# Patient Record
Sex: Male | Born: 1975 | Race: Black or African American | Hispanic: No | Marital: Single | Smoking: Current every day smoker
Health system: Southern US, Community
[De-identification: ages and names within clinical notes are randomized; demographics above are authoritative.]

## PROBLEM LIST (undated history)

## (undated) DIAGNOSIS — H269 Unspecified cataract: Secondary | ICD-10-CM

## (undated) HISTORY — DX: Unspecified cataract: H26.9

---

## 2011-06-26 ENCOUNTER — Emergency Department (HOSPITAL_COMMUNITY)
Admission: EM | Admit: 2011-06-26 | Discharge: 2011-06-27 | Disposition: A | Discharge status: Home / Self Care | Payer: Self-pay | Attending: Emergency Medicine | Admitting: Emergency Medicine

## 2011-06-26 DIAGNOSIS — R002 Palpitations: Secondary | ICD-10-CM

## 2011-06-26 DIAGNOSIS — R079 Chest pain, unspecified: Secondary | ICD-10-CM | POA: Insufficient documentation

## 2011-06-26 DIAGNOSIS — K089 Disorder of teeth and supporting structures, unspecified: Secondary | ICD-10-CM | POA: Insufficient documentation

## 2011-06-26 DIAGNOSIS — F17200 Nicotine dependence, unspecified, uncomplicated: Secondary | ICD-10-CM | POA: Insufficient documentation

## 2011-06-26 DIAGNOSIS — K0889 Other specified disorders of teeth and supporting structures: Secondary | ICD-10-CM

## 2011-06-26 NOTE — ED Notes (Signed)
Pt states "I just feel loopy"  "I took an anaciid and ibuprofen today"  Pt reports pain radiating to throat and left arm.  Pt denies n/v or SOB.  Pt reports pain in mid chest wall.  Pt rates pain 5/10.   Pt states "Its more like discomfort and not pain"

## 2011-06-27 ENCOUNTER — Emergency Department (HOSPITAL_COMMUNITY): Payer: Self-pay

## 2011-06-27 MED ORDER — IBUPROFEN 600 MG PO TABS: 600.0000 mg | ORAL_TABLET | Freq: Three times a day (TID) | ORAL | Status: AC | PRN

## 2011-06-27 MED ORDER — PENICILLIN V POTASSIUM 500 MG PO TABS: 500.0000 mg | ORAL_TABLET | Freq: Four times a day (QID) | ORAL | Status: AC

## 2011-06-27 NOTE — ED Notes (Signed)
Pt denies pain stating "I don't feel bad like I did when I came in here". Pt denies pain; no s/s of distress noted.

## 2011-06-27 NOTE — ED Provider Notes (Signed)
History     CSN: [id number]  Arrival date & time 06/26/11  2225   First MD Initiated Contact with Patient 06/27/11 0401      Chief Complaint  Patient presents with  . Chest Pain     The history is provided by the patient.   The patient reports right-sided dental pain that started today.  He took ibuprofen Orajel with improvement in his symptoms.  As he was concerned about his tooth he began having palpitations without chest discomfort.  His palpitations are transient and reports only occasionally.  He denies shortness of breath.  He has no history of hypertension hyperlipidemia diabetes.  There is no family history of early heart disease.  He denies chest pain or palpitations at this time.  He does smoke cigarettes but does not do any drugs.  He does not have a primary care physician.     No past medical history on file.  No past surgical history on file.  No family history on file.  History  Substance Use Topics  . Smoking status: Not on file  . Smokeless tobacco: Not on file  . Alcohol Use: Not on file      Review of Systems  All other systems reviewed and are negative.    Allergies  Review of patient's allergies indicates no known allergies.  Home Medications  No current outpatient prescriptions on file.  BP 123/83  Pulse 48  Temp 98.8 F (37.1 C) (Oral)  Resp 25  SpO2 100%  Physical Exam  Nursing note and vitals reviewed. Constitutional: He is oriented to person, place, and time. He appears well-developed and well-nourished.  HENT:  Head: Normocephalic and atraumatic.       Mild tenderness of his right lower second molar without gingival swelling or fluctuance  Eyes: EOM are normal.  Neck: Normal range of motion.  Cardiovascular: Normal rate, regular rhythm, normal heart sounds and intact distal pulses.   Pulmonary/Chest: Effort normal and breath sounds normal. No respiratory distress.  Abdominal: Soft. He exhibits no distension. There is no  tenderness.  Musculoskeletal: Normal range of motion.  Neurological: He is alert and oriented to person, place, and time.  Skin: Skin is warm and dry.  Psychiatric: He has a normal mood and affect. Judgment normal.    ED Course  Procedures (including critical care time)  Labs Reviewed - No data to display Dg Chest 2 View  06/27/2011  *RADIOLOGY REPORT*  Clinical Data: Left upper chest pain for 24 hours.  CHEST - 2 VIEW  Comparison: None.  Findings: The heart size and pulmonary vascularity are normal. The lungs appear clear and expanded without focal air space disease or consolidation. No blunting of the costophrenic angles.  No pneumothorax.  IMPRESSION: No evidence of active pulmonary disease.  Original Report Authenticated By: Marlon Pel, M.D.    I personally reviewed the imaging tests through PACS system  I reviewed available ER/hospitalization records thought the EMR    Date: 06/27/2011  Rate: 67  Rhythm: normal sinus rhythm  QRS Axis: normal  Intervals: normal  ST/T Wave abnormalities: normal  Conduction Disutrbances: none  Narrative Interpretation:   Old EKG Reviewed: No significant changes noted     1. Pain, dental   2. Palpitations       MDM  Dental Pain. Home with antibiotics and pain medicine. Recommend dental follow up. No signs of gingival abscess. Tolerating secretions. Airway patent. No sub lingular swelling  Benign palpitations. Doubt ACS. ecg normal.  Lyanne Co, MD 06/27/11 [phone]

## 2011-06-27 NOTE — ED Notes (Signed)
Patient transported to X-ray 

## 2011-06-27 NOTE — Discharge Instructions (Signed)
Dental Pain  A tooth ache may be caused by cavities (tooth decay). Cavities expose the nerve of the tooth to air and hot or cold temperatures. It may come from an infection or abscess (also called a boil or furuncle) around your tooth. It is also often caused by dental caries (tooth decay). This causes the pain you are having.  DIAGNOSIS   Your caregiver can diagnose this problem by exam.  TREATMENT   · If caused by an infection, it may be treated with medications which kill germs (antibiotics) and pain medications as prescribed by your caregiver. Take medications as directed.  · Only take over-the-counter or prescription medicines for pain, discomfort, or fever as directed by your caregiver.  · Whether the tooth ache today is caused by infection or dental disease, you should see your dentist as soon as possible for further care.  SEEK MEDICAL CARE IF:  The exam and treatment you received today has been provided on an emergency basis only. This is not a substitute for complete medical or dental care. If your problem worsens or new problems (symptoms) appear, and you are unable to meet with your dentist, call or return to this location.  SEEK IMMEDIATE MEDICAL CARE IF:   · You have a fever.  · You develop redness and swelling of your face, jaw, or neck.  · You are unable to open your mouth.  · You have severe pain uncontrolled by pain medicine.  MAKE SURE YOU:   · Understand these instructions.  · Will watch your condition.  · Will get help right away if you are not doing well or get worse.  Document Released: 12/19/2004 Document Revised: 12/08/2010 Document Reviewed: 08/07/2007  Exit[employer]® Patient Information ©2012 [employer].

## 2011-06-27 NOTE — ED Notes (Signed)
Returned from xray

## 2013-12-27 IMAGING — CR DG CHEST 2V
2 series · 2 of 2 positions shown · non-contrast
Comparison: None.

CLINICAL DATA: Left upper chest pain for 24 hours.

CHEST - 2 VIEW

[w chest lat]
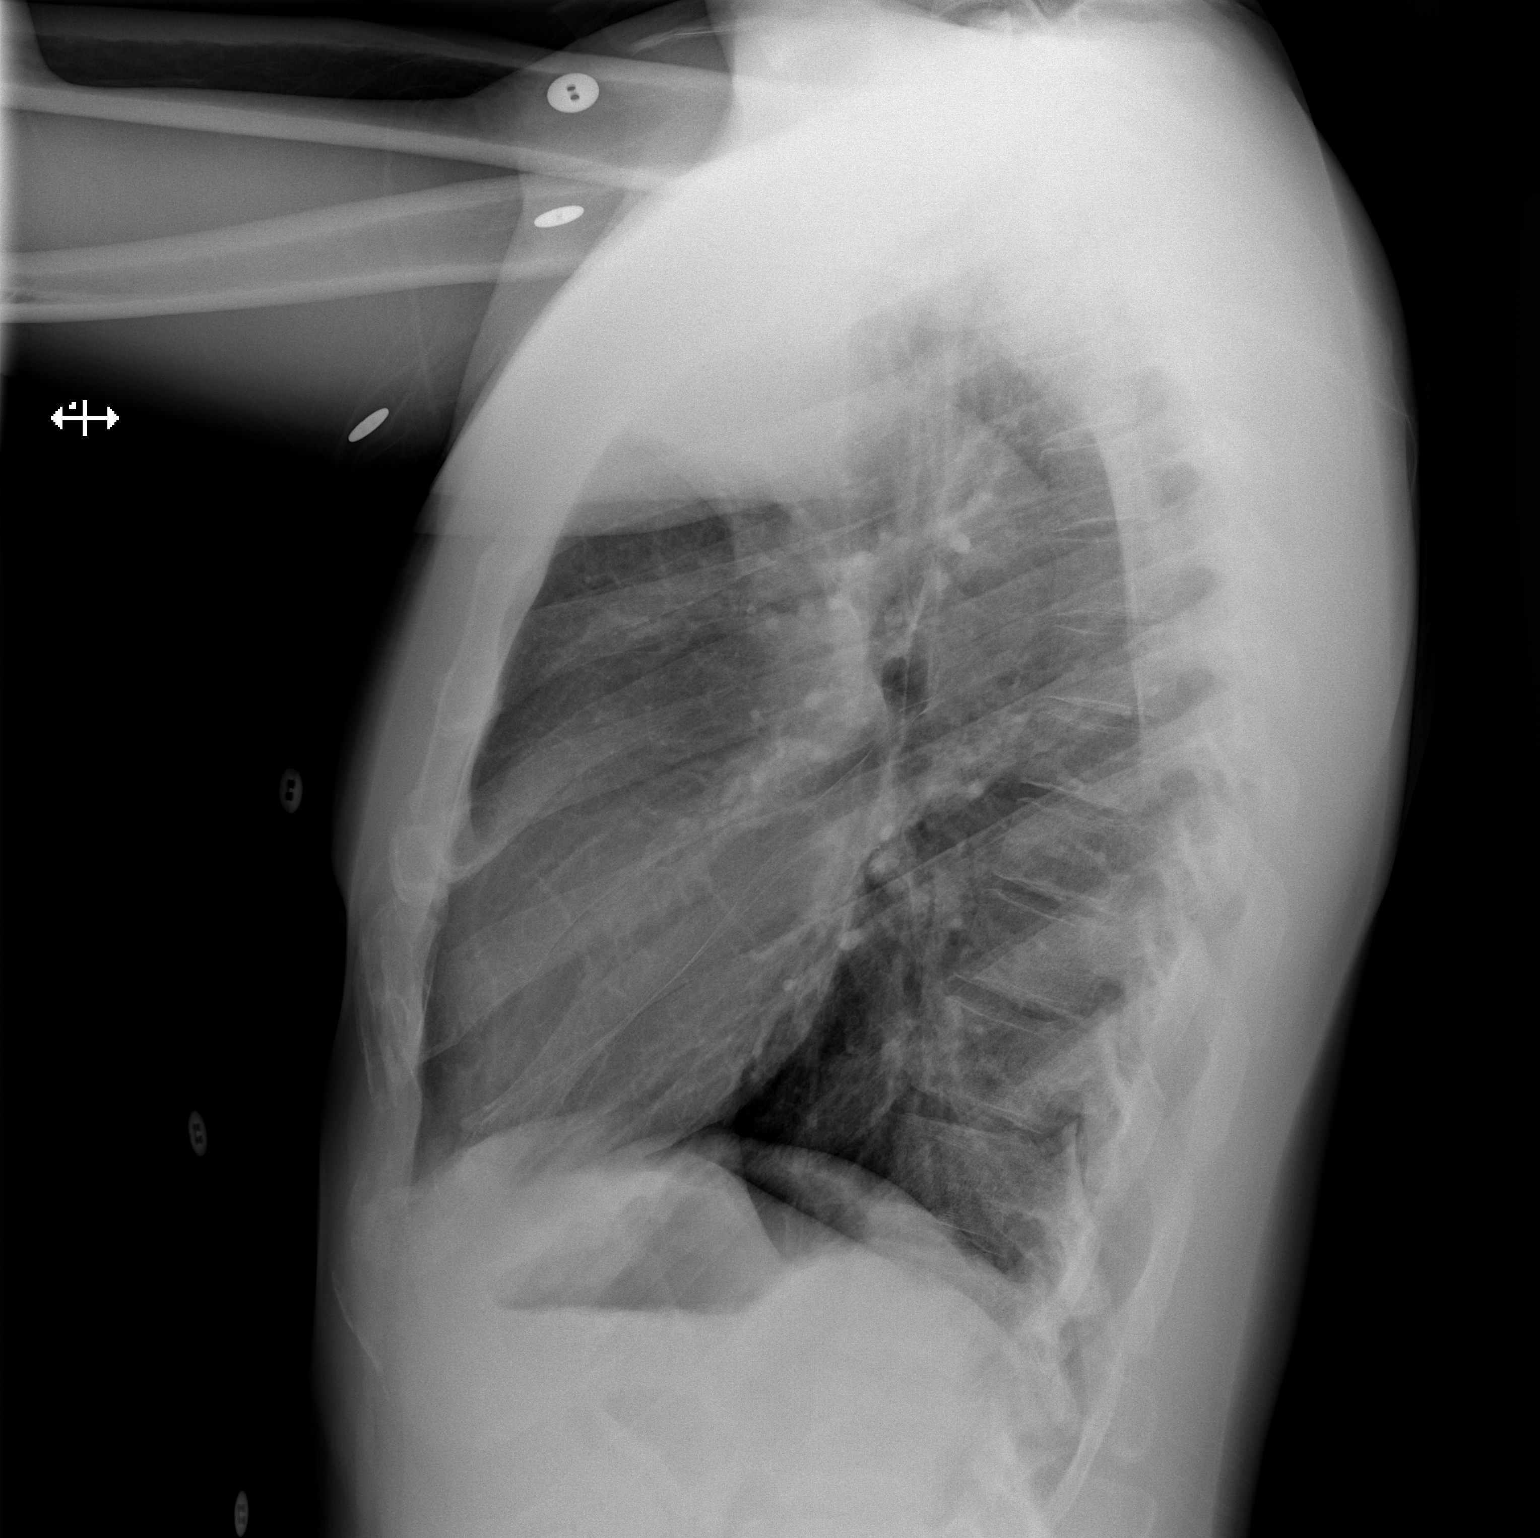

[w chest pa]
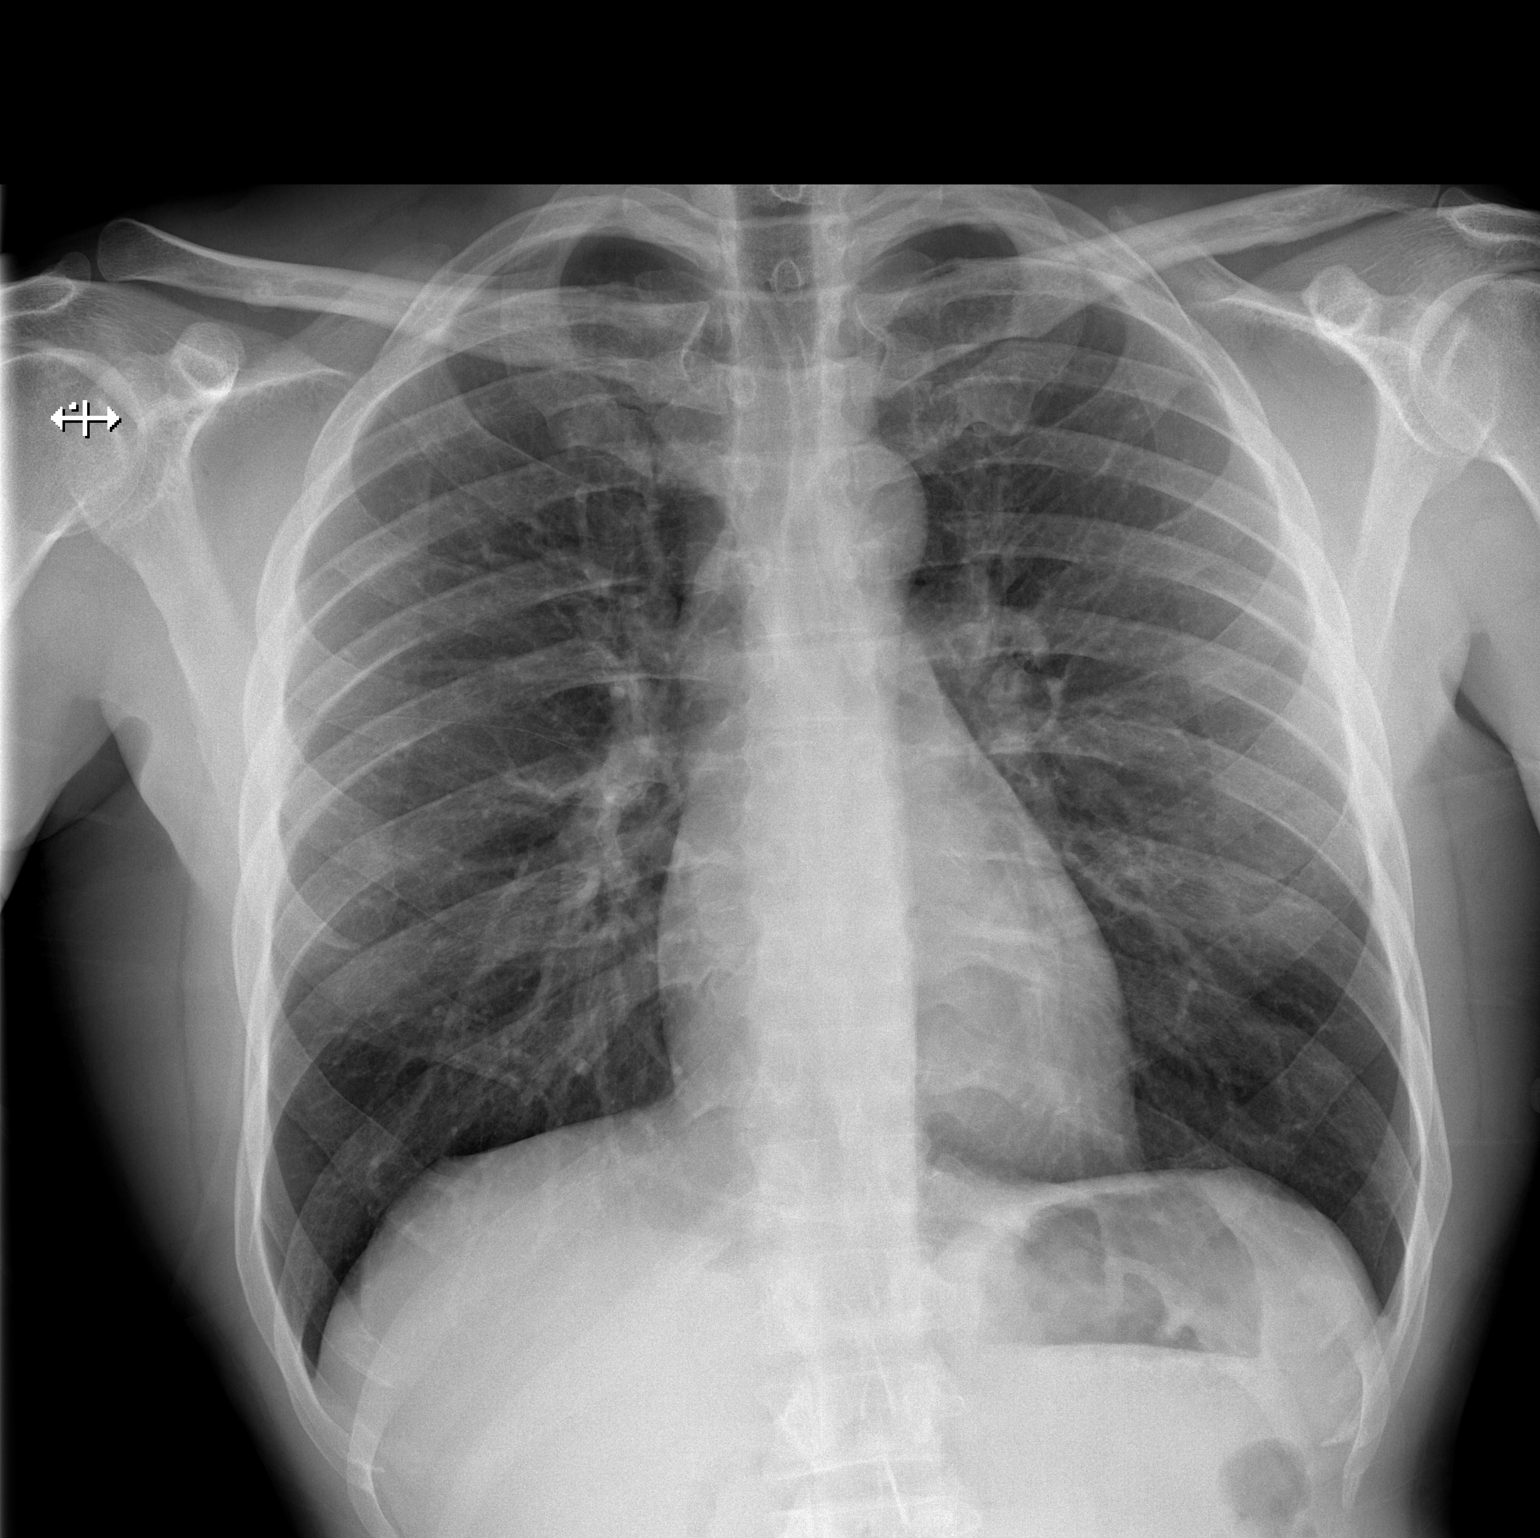

[2 of 2 positions shown; findings below may reference images not displayed]

FINDINGS: The heart size and pulmonary vascularity are normal. The
lungs appear clear and expanded without focal air space disease or
consolidation. No blunting of the costophrenic angles.  No
pneumothorax.
IMPRESSION: No evidence of active pulmonary disease.

## 2018-06-11 ENCOUNTER — Encounter (INDEPENDENT_AMBULATORY_CARE_PROVIDER_SITE_OTHER): Payer: Self-pay | Admitting: Ophthalmology

## 2018-06-11 ENCOUNTER — Ambulatory Visit (INDEPENDENT_AMBULATORY_CARE_PROVIDER_SITE_OTHER): Payer: Worker's Compensation | Admitting: Ophthalmology

## 2018-06-11 ENCOUNTER — Ambulatory Visit (HOSPITAL_COMMUNITY)
Admission: EM | Admit: 2018-06-11 | Discharge: 2018-06-11 | Disposition: A | Discharge status: Home / Self Care | Payer: Worker's Compensation | Attending: Family Medicine | Admitting: Family Medicine

## 2018-06-11 ENCOUNTER — Encounter (HOSPITAL_COMMUNITY): Payer: Self-pay

## 2018-06-11 ENCOUNTER — Other Ambulatory Visit: Payer: Self-pay

## 2018-06-11 DIAGNOSIS — H209 Unspecified iridocyclitis: Secondary | ICD-10-CM | POA: Diagnosis not present

## 2018-06-11 DIAGNOSIS — H3581 Retinal edema: Secondary | ICD-10-CM | POA: Diagnosis not present

## 2018-06-11 DIAGNOSIS — Q141 Congenital malformation of retina: Secondary | ICD-10-CM | POA: Diagnosis not present

## 2018-06-11 DIAGNOSIS — S0592XA Unspecified injury of left eye and orbit, initial encounter: Secondary | ICD-10-CM

## 2018-06-11 DIAGNOSIS — H2513 Age-related nuclear cataract, bilateral: Secondary | ICD-10-CM

## 2018-06-11 MED ORDER — FLUORESCEIN SODIUM 1 MG OP STRP
ORAL_STRIP | OPHTHALMIC | Status: AC
  Filled 2018-06-11: qty 1

## 2018-06-11 MED ORDER — PREDNISOLONE ACETATE 1 % OP SUSP: 1.0000 [drp] | OPHTHALMIC | 0 refills | Status: AC

## 2018-06-11 MED ORDER — TETRACAINE HCL 0.5 % OP SOLN
OPHTHALMIC | Status: AC
  Filled 2018-06-11: qty 4

## 2018-06-11 NOTE — ED Triage Notes (Signed)
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 Patient reports his vision is slightly effected when he first wakes up in the morning, there is blurriness and light sensitivity.

## 2018-06-11 NOTE — Discharge Instructions (Signed)
Please go to Dr. Coralyn Pear office at 12:45 for 1 oclock appoitnment. Please call when you leave her so they can screen you.

## 2018-06-11 NOTE — Progress Notes (Addendum)
[facility] - Clinic Note  06/11/2018     CHIEF COMPLAINT Patient presents for Retina Evaluation   HISTORY OF PRESENT ILLNESS: Tanner Galvan is a 43 y.o. male who presents to the clinic today for:   HPI    Retina Evaluation    In left eye.  This started days ago.  Duration of days.  Associated Symptoms Pain and Redness.  I, the attending physician,  performed the HPI with the patient and updated documentation appropriately.          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  Patient states he was approximately 8-10 feet away from his co-worker when a rock flew out from weed eater and hit him in his left eye.  Patient states left eye is painful, light sensitive, dry, and red.  Patient states these symptoms have gotten worse since Friday after occurrence.        Last edited by Rennis ChrisZamora, Kooper Godshall, MD on 06/11/2018  1:31 PM. (History)    pt states Friday afternoon, he got hit in the eye with a rock, he states he did not see anyone about it until today when he went to urgent care, he states on Friday he noticed it was red, he states his vision was a little blurry, he states he thinks his eye was open and it hit him in the center of the eye, he states he tried to flush it out with water, he states when he got home he got in the shower and let the water run over it, but states it did not seem to help, he states over the weekend he tried using drops, he states the pain was not as bad over the weekend, he states today his eye is still red and the pain is worse than over the weekend, he states he has been sensitive to light since the beginning, pt states he did see floaters after the incident, but they are gone now  Referring physician: Lew DawesWieters, Hallie C, PA-C [address] [place], [place] [id number]  HISTORICAL INFORMATION:   Selected notes from the MEDICAL RECORD NUMBER  [facility]:  Ocular Hx- PMH-   CURRENT MEDICATIONS: Current Outpatient Medications (Ophthalmic Drugs)  Medication Sig  . prednisoLONE acetate (PRED FORTE) 1 % ophthalmic suspension Place 1 drop into the left eye every 2 (two) hours while awake.   No current facility-administered medications for this visit.  (Ophthalmic Drugs)   No current outpatient medications on file. (Other)   No current facility-administered medications for this visit.  (Other)      REVIEW OF SYSTEMS: ROS    Positive for: Eyes   Negative for: Constitutional, Gastrointestinal, Neurological, Skin, Genitourinary, Musculoskeletal, HENT, Endocrine, Cardiovascular, Respiratory, Psychiatric, Allergic/Imm, Heme/Lymph   Last edited by Corrinne EagleEnglish, [name] on 06/11/2018 12:54 PM. (History)       ALLERGIES No Known Allergies  PAST MEDICAL HISTORY History reviewed. No pertinent past medical history. History reviewed. No pertinent surgical history.  FAMILY HISTORY Family History  Problem Relation Age of Onset  . Diabetes Mother     SOCIAL HISTORY Social History   Tobacco Use  . Smoking status: Current Every Day Smoker    Packs/day: 0.50    Types: Cigarettes  . Smokeless tobacco: Never Used  Substance Use Topics  . Alcohol use: Yes  . Drug  use: Not on file         OPHTHALMIC EXAM:  Base Eye Exam    Visual Acuity (Snellen - Linear)      Right Left   Dist cc 20/25 20/30 -2   Dist ph cc 20/20 -3 20/20 -3       Tonometry (Tonopen, 1:05 PM)      Right Left   Pressure 10 7       Pupils      Dark Light Shape React APD   Right 4 3 Round Brisk 0   Left 6 5 Round Minimal 0       Extraocular Movement      Right Left    Full Full       Neuro/Psych    Oriented x3:  Yes   Mood/Affect:  Normal       Dilation    Both eyes:  1.0% Mydriacyl, 2.5% Phenylephrine @ 1:05 PM        Slit Lamp and Fundus Exam    Slit Lamp Exam      Right Left    Lids/Lashes mild Meibomian gland dysfunction mild lid edema   Conjunctiva/Sclera mild Melanosis 3+ Injection, mild Melanosis   Cornea mild Arcus mild Arcus, no epi defect   Anterior Chamber Deep and quiet deep, 2+cell/pigment   Iris Round and dilated Round and moderately dilated   Lens 1+ Cortical cataract 1+ Nuclear sclerosis, 1+ Cortical cataract   Vitreous Vitreous syneresis Vitreous syneresis       Fundus Exam      Right Left   Disc Pink and Sharp Pink and Sharp   C/D Ratio 0.4 0.55   Macula Flat, Good foveal reflex, No heme or edema Flat, Good foveal reflex, No heme or edema   Vessels mild Vascular attenuation, mild Copper wiring mild Vascular attenuation, Tortuousity   Periphery Attached, pigmented CHRPE with +lacunae at 0900 Attached, No RT/RD        Refraction    Wearing Rx      Sphere Cylinder Axis   Right -2.25 +1.50 165   Left -2.25 +1.25 020       Manifest Refraction      Sphere Cylinder Axis Dist VA   Right -2.50 +1.50 165 20/20-3   Left -2.25 +1.25 020 20/25-2          IMAGING AND PROCEDURES  Imaging and Procedures for @TODAY @  OCT, Retina - OU - Both Eyes       Right Eye Quality was good. Central Foveal Thickness: 268. Progression has no prior data. Findings include normal foveal contour, no SRF, no IRF (Trace ERM).   Left Eye Quality was good. Central Foveal Thickness: 266. Progression has no prior data. Findings include normal foveal contour, no SRF, no IRF.   Notes *Images captured and stored on drive  Diagnosis / Impression:  NFP, no IRF/SRF OU Trace ERM OD   Clinical management:  See below  Abbreviations: NFP - Normal foveal profile. CME - cystoid macular edema. PED - pigment epithelial detachment. IRF - intraretinal fluid. SRF - subretinal fluid. EZ - ellipsoid zone. ERM - epiretinal membrane. ORA - outer retinal atrophy. ORT - outer retinal tubulation. SRHM - subretinal hyper-reflective material                  ASSESSMENT/PLAN:    ICD-10-CM   1. 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 Retinal edema H35.81 OCT, Retina - OU - Both Eyes  3. 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 Traumatic Iritis OS  - mechanism of injury: rock to the eye on Friday, 6.5.20, while doing landscaping work w/ weed eater  - +photophobia  - 3+ conj injection  - AC with 3+ cell/pigment; poor dilation  - BCVA 20/20 OU  - no foreign body, corneal abrasion, conj laceration  - start PF Q2H OS while awake  - start Atropine BID OS  [facility]  2. No retinal edema on exam or OCT  3. Nuclear sclerosis OU  - The symptoms of cataract, surgical options, and treatments and risks were discussed with patient.  - discussed diagnosis and progression  - not yet visually significant  - monitor for now  4. CHRPE OD  - discrete pigmented lesion with lacuane located at 0900  - discussed findings and prognosis  - Optos images obtained today  - monitor   Ophthalmic Meds Ordered this visit:  Meds ordered this encounter  Medications  . prednisoLONE acetate (PRED FORTE) 1 % ophthalmic suspension    Sig: Place 1 drop into the left eye every 2 (two) hours while awake.    Dispense:  15 mL    Refill:  0       Return in about 3 days (around 06/14/2018) for f/u Friday 10:15 AM, eye injury OS.  There are no Patient Instructions on file for this visit.   Explained the diagnoses, plan, and follow up with the patient and they expressed understanding.  Patient expressed understanding of the importance of proper follow up care.   This document serves as a record of services personally performed by Karie ChimeraBrian G. Joselle Deeds, MD, PhD. It was created on their behalf by Laurian BrimAmanda Brown, OA, an ophthalmic assistant. The creation of this record is the provider's dictation and/or activities during the visit.    Electronically signed by: Laurian BrimAmanda Brown, OA  06.09.2020 5:20 PM     Karie ChimeraBrian G. Dallys Nowakowski, M.D., Ph.D. Diseases & Surgery of the Retina and Vitreous [facility]  I have reviewed the above documentation for accuracy and completeness, and I agree with the above. Karie ChimeraBrian G. Aahna Rossa, M.D., Ph.D. 06/11/18 5:20 PM   Abbreviations: M myopia (nearsighted); A astigmatism; H hyperopia (farsighted); P presbyopia; Mrx spectacle prescription;  CTL contact lenses; OD right eye; OS left eye; OU both eyes  XT exotropia; ET esotropia; PEK punctate epithelial keratitis; PEE punctate epithelial erosions; DES dry eye syndrome; MGD meibomian gland dysfunction; ATs artificial tears; PFAT's preservative free artificial tears; NSC nuclear sclerotic cataract; PSC posterior subcapsular cataract; ERM epi-retinal membrane; PVD posterior vitreous detachment; RD retinal detachment; DM diabetes mellitus; DR diabetic retinopathy; NPDR non-proliferative diabetic retinopathy; PDR proliferative diabetic retinopathy; CSME clinically significant macular edema; DME diabetic macular edema; dbh dot blot hemorrhages; CWS cotton wool spot; POAG primary open angle glaucoma; C/D cup-to-disc ratio; HVF humphrey visual field; GVF goldmann visual field; OCT optical coherence tomography; IOP intraocular pressure; BRVO Branch retinal vein occlusion; CRVO central retinal vein occlusion; CRAO central retinal artery occlusion; BRAO branch retinal artery occlusion; RT retinal tear; SB scleral buckle; PPV pars plana vitrectomy; VH Vitreous hemorrhage; PRP panretinal laser photocoagulation; IVK intravitreal kenalog; VMT vitreomacular traction; MH Macular hole;  NVD neovascularization of the disc; NVE neovascularization elsewhere; AREDS age related eye disease study; ARMD age related macular degeneration; POAG primary open angle glaucoma; EBMD epithelial/anterior basement membrane dystrophy; ACIOL anterior chamber intraocular lens; IOL intraocular  lens; PCIOL posterior chamber intraocular lens; Phaco/IOL  phacoemulsification with intraocular lens placement; Sharpes photorefractive keratectomy; LASIK laser assisted in situ keratomileusis; HTN hypertension; DM diabetes mellitus; COPD chronic obstructive pulmonary disease

## 2018-06-11 NOTE — ED Provider Notes (Signed)
[place]    CSN: [id number] Arrival date & time: 06/11/18  1014     History   Chief Complaint Chief Complaint  Patient presents with  . Eye Problem    HPI Fields Tanner Galvan is a 43 y.o. male no significant past medical history presenting today for evaluation of left eye injury.  Patient states 4 days ago as he was working as a Development worker, international aid rock flew up from a [employer] and hit him in the left eye.  Since he has had discomfort, irritation, redness, watery drainage and photophobia.  He denies history of contact use.  Does wear reading glasses.  Occasional blurriness with trying to focus as well as with the watering but denies other changes in vision.  Denies pain with movement of his eye.  HPI  History reviewed. No pertinent past medical history.  There are no active problems to display for this patient.   History reviewed. No pertinent surgical history.     Home Medications    Prior to Admission medications   Not on File    Family History Family History  Problem Relation Age of Onset  . Diabetes Mother     Social History Social History   Tobacco Use  . Smoking status: Current Every Day Smoker    Packs/day: 0.50    Types: Cigarettes  . Smokeless tobacco: Never Used  Substance Use Topics  . Alcohol use: Yes  . Drug use: Not on file     Allergies   Patient has no known allergies.   Review of Systems Review of Systems  Constitutional: Negative for activity change, appetite change, chills, fatigue and fever.  HENT: Negative for congestion, ear pain, rhinorrhea, sinus pressure, sore throat and trouble swallowing.   Eyes: Positive for photophobia, pain, discharge, redness and visual disturbance.  Respiratory: Negative for cough, chest tightness and shortness of breath.   Cardiovascular: Negative for chest pain.  Gastrointestinal: Negative for abdominal pain, diarrhea, nausea and vomiting.  Musculoskeletal: Negative for myalgias.  Skin:  Negative for rash.  Neurological: Negative for dizziness, light-headedness and headaches.     Physical Exam Triage Vital Signs ED Triage Vitals  Enc Vitals Group     BP 06/11/18 1034 (!) 130/92     Pulse Rate 06/11/18 1034 77     Resp 06/11/18 1034 18     Temp 06/11/18 1034 98 F (36.7 C)     Temp Source 06/11/18 1034 Oral     SpO2 06/11/18 1034 100 %     Weight --      Height --      Head Circumference --      Peak Flow --      Pain Score 06/11/18 1031 6     Pain Loc --      Pain Edu? --      Excl. in [place]? --    No data found.  Updated Vital Signs BP (!) 130/92 (BP Location: Left Arm)   Pulse 77   Temp 98 F (36.7 C) (Oral)   Resp 18   SpO2 100%   Visual Acuity Right Eye Distance: 20/25 Left Eye Distance: 20/25 Bilateral Distance: 20/25  Right Eye Near:   Left Eye Near:    Bilateral Near:     Physical Exam Vitals signs and nursing note reviewed.  Constitutional:      Appearance: He is well-developed.  HENT:     Head: Normocephalic and atraumatic.  Eyes:     Comments: Left eye  with approximately 1 cm dilated pupil compared to right which is approximately 0.5 cm dilated, left pupil less reactive to light, extraocular motions intact Left conjunctivo-with erythema, does appear to have a few fine specks of possible foreign body in left lower quadrant of eye, no abrasion or ulcer observed on fluorescein staining  Pressure approximately 10 on average  Neck:     Musculoskeletal: Neck supple.  Cardiovascular:     Rate and Rhythm: Normal rate and regular rhythm.     Heart sounds: No murmur.  Pulmonary:     Effort: Pulmonary effort is normal. No respiratory distress.     Breath sounds: Normal breath sounds.  Abdominal:     Palpations: Abdomen is soft.     Tenderness: There is no abdominal tenderness.  Skin:    General: Skin is warm and dry.  Neurological:     Mental Status: He is alert.      UC Treatments / Results  Labs (all labs ordered are listed,  but only abnormal results are displayed) Labs Reviewed - No data to display  EKG None  Radiology No results found.  Procedures Procedures (including critical care time)  Medications Ordered in UC Medications - No data to display  Initial Impression / Assessment and Plan / UC Course  I have reviewed the triage vital signs and the nursing notes.  Pertinent labs & imaging results that were available during my care of the patient were reviewed by me and considered in my medical decision making (see chart for details).     Called Dr. Vanessa BarbaraZamora, ophthalmology on-call and discussed case with him who plans to see him at 1:00 today arrive at 12:45 for further evaluation.  Provided patient with contact information/address, advised to call his office after leaving in order to have COVID screening.  Patient verbalized understanding and is agreeable with plan.  Final Clinical Impressions(s) / UC Diagnoses   Final diagnoses:  Left eye injury, initial encounter     Discharge Instructions     Please go to Dr. Vanessa BarbaraZamora office at 12:45 for 1 oclock appoitnment. Please call when you leave her so they can screen you.    ED Prescriptions    None     Controlled Substance Prescriptions [place] Controlled Substance Registry consulted? Not Applicable   Lew DawesWieters, Talah Cookston C, [place]-C 06/11/18 1122

## 2018-06-12 NOTE — Progress Notes (Signed)
[facility] - Clinic Note  06/14/2018     CHIEF COMPLAINT Patient presents for Retina Follow Up   HISTORY OF PRESENT ILLNESS: Tanner Galvan is a 43 y.o. male who presents to the clinic today for:   HPI    Retina Follow Up    Patient presents with  Other.  In left eye.  This started 3 weeks ago.  Severity is moderate.  Duration of 3 days.  Since onset it is stable.  I, the attending physician,  performed the HPI with the patient and updated documentation appropriately.          Comments    43 y/o male pt here for 3 day f/u for traumatic iritis OS following eye injury on 06.09.20.  No change in [place].  Pt had a really good day yesterday, but woke up this morning with extreme pain and photophobia OS.  Symptoms have been constant since waking.  Pain level currently at a 10 OS.  Denies flashes, floaters.  OS is more red than yesterday as well.  Has been using PF OS when he remembers to while awake, but admits it has been hard to keep the schedule of every 2 hours.  Atropine BID OS.       Last edited by Rennis ChrisZamora, Belva Koziel, MD on 06/18/2018 12:17 AM. (History)    pt states his eye is painful today, he states he had to take some ibuprofen earlier this morning, he states he is having a hard time keeping up with the eye drop schedule while he is at work, bc he doesn't keep his phone on him, he states he was pretty good with them on Wednesday, but probably only got 3 drops in yesterday  Referring physician: No referring provider defined for this encounter.  HISTORICAL INFORMATION:   Selected notes from the MEDICAL RECORD NUMBER [facility]:  Ocular Hx- PMH-   CURRENT MEDICATIONS: Current Outpatient Medications (Ophthalmic Drugs)  Medication Sig  . atropine 1 % ophthalmic solution Place 1 drop into the left eye every 2 (two) hours while awake.  . prednisoLONE acetate (PRED FORTE) 1 % ophthalmic suspension Place 1 drop into the  left eye every 2 (two) hours while awake.   No current facility-administered medications for this visit.  (Ophthalmic Drugs)   No current outpatient medications on file. (Other)   No current facility-administered medications for this visit.  (Other)      REVIEW OF SYSTEMS: ROS    Positive for: Eyes   Negative for: Constitutional, Gastrointestinal, Neurological, Skin, Genitourinary, Musculoskeletal, HENT, Endocrine, Cardiovascular, Respiratory, Psychiatric, Allergic/Imm, Heme/Lymph   Last edited by Celine MansBaxley, Andrew G, COA on 06/14/2018 10:32 AM. (History)       ALLERGIES No Known Allergies  PAST MEDICAL HISTORY Past Medical History:  Diagnosis Date  . Cataract    OU   History reviewed. No pertinent surgical history.  FAMILY HISTORY Family History  Problem Relation Age of Onset  . Diabetes Mother     SOCIAL HISTORY Social History   Tobacco Use  . Smoking status: Current Every Day Smoker    Packs/day: 0.50    Types: Cigarettes  . Smokeless tobacco: Never Used  Substance Use Topics  . Alcohol use: Yes  . Drug use: Not on file         OPHTHALMIC EXAM:  Base Eye Exam    Visual Acuity (Snellen - Linear)      Right Left  Dist cc 20/25 20/30 +2   Dist ph cc NI NI   Correction: Glasses       Tonometry (Tonopen, 10:36 AM)      Right Left   Pressure 10 8       Pupils      Dark Light Shape React APD   Right 4 3 Round Brisk None   Left 6 6 Round Minimal None  Pharm dil OS       Visual Fields (Counting fingers)      Left Right    Full Full       Extraocular Movement      Right Left    Full, Ortho Full, Ortho       Neuro/Psych    Oriented x3: Yes   Mood/Affect: Normal       Dilation    Both eyes: 1.0% Mydriacyl, 2.5% Phenylephrine @ 10:36 AM        Slit Lamp and Fundus Exam    Slit Lamp Exam      Right Left   Lids/Lashes mild Meibomian gland dysfunction mild lid edema   Conjunctiva/Sclera mild Melanosis 2+ Injection, mild Melanosis    Cornea mild Arcus mild Arcus, no epi defect   Anterior Chamber Deep and quiet deep, 2-3+cell/pigment   Iris Round and dilated Round and moderately dilated   Lens 1+ Cortical cataract 1+ Nuclear sclerosis, 1+ Cortical cataract   Vitreous Vitreous syneresis Vitreous syneresis       Fundus Exam      Right Left   Disc Pink and Sharp Pink and Sharp   C/D Ratio 0.4 0.55   Macula Flat, Good foveal reflex, No heme or edema Flat, Good foveal reflex, No heme or edema   Vessels mild Vascular attenuation, mild Copper wiring mild Vascular attenuation, Tortuousity   Periphery Attached, pigmented CHRPE with +lacunae at 0900 Attached, No RT/RD          IMAGING AND PROCEDURES  Imaging and Procedures for @TODAY @  OCT, Retina - OU - Both Eyes       Right Eye Quality was good. Central Foveal Thickness: 272. Progression has no prior data. Findings include normal foveal contour, no SRF, no IRF (Trace ERM).   Left Eye Quality was good. Central Foveal Thickness: 269. Progression has no prior data. Findings include normal foveal contour, no SRF, no IRF.   Notes *Images captured and stored on drive  Diagnosis / Impression:  NFP, no IRF/SRF OU Trace ERM OD   Clinical management:  See below  Abbreviations: NFP - Normal foveal profile. CME - cystoid macular edema. PED - pigment epithelial detachment. IRF - intraretinal fluid. SRF - subretinal fluid. EZ - ellipsoid zone. ERM - epiretinal membrane. ORA - outer retinal atrophy. ORT - outer retinal tubulation. SRHM - subretinal hyper-reflective material                 ASSESSMENT/PLAN:    ICD-10-CM   1. 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 Retinal edema  H35.81 OCT, Retina - OU - Both Eyes  3. 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 No retinal edema on exam or OCT  3. Nuclear sclerosis OU  - The symptoms of cataract, surgical options, and treatments and risks were discussed with patient.  - discussed diagnosis and progression  - not yet visually significant  - monitor for now  4. CHRPE OD  - discrete pigmented lesion with lacuane located at 0900 -- stable  - Optos images obtained at initial visit  - monitor   Ophthalmic Meds Ordered this visit:  No orders of the defined types were placed in this encounter.      Return in about 10 days (around 06/24/2018) for f/u 6.22.20 at 10:00, traumatic iritis OS.  There are no Patient Instructions on file for this visit.   Explained the diagnoses, plan, and follow up with the patient and they expressed understanding.  Patient expressed understanding of the importance of proper follow up care.   This document serves as a record of services personally performed by Karie ChimeraBrian G. Blanche Scovell, MD, PhD. It was created on their behalf by Laurian BrimAmanda Brown, OA, an ophthalmic assistant. The creation of this record is the provider's dictation and/or activities during the visit.    Electronically signed by: Laurian BrimAmanda Brown, OA  06.10.2020 12:19 AM    Karie ChimeraBrian G. Abraham Margulies, M.D., Ph.D. Diseases & Surgery of the Retina and Vitreous [facility]  I have reviewed the above documentation for accuracy and completeness, and I agree with the above. Karie ChimeraBrian G. Lotta Frankenfield, M.D., Ph.D. 06/18/18 12:21 AM    Abbreviations: M myopia (nearsighted); A astigmatism; H hyperopia (farsighted); P presbyopia; Mrx spectacle  prescription;  CTL contact lenses; OD right eye; OS left eye; OU both eyes  XT exotropia; ET esotropia; PEK punctate epithelial keratitis; PEE punctate epithelial erosions; DES dry eye syndrome; MGD meibomian gland dysfunction; ATs artificial tears; PFAT's preservative free artificial tears; NSC nuclear sclerotic cataract; PSC posterior subcapsular cataract; ERM epi-retinal membrane; PVD posterior vitreous detachment; RD retinal detachment; DM diabetes mellitus; DR diabetic retinopathy; NPDR non-proliferative diabetic retinopathy; PDR proliferative diabetic retinopathy; CSME clinically significant macular edema; DME diabetic macular edema; dbh dot blot hemorrhages; CWS cotton wool spot; POAG primary open angle glaucoma; C/D cup-to-disc ratio; HVF humphrey visual field; GVF goldmann visual field; OCT optical coherence tomography; IOP intraocular pressure; BRVO Branch retinal vein occlusion; CRVO central retinal vein occlusion; CRAO central retinal artery occlusion; BRAO branch retinal artery occlusion; RT retinal tear; SB scleral buckle; PPV pars plana vitrectomy; VH Vitreous hemorrhage; PRP panretinal laser photocoagulation; IVK intravitreal kenalog; VMT vitreomacular traction; MH Macular hole;  NVD neovascularization of the disc; NVE neovascularization elsewhere; AREDS age related eye disease study; ARMD age related macular degeneration; POAG primary open angle glaucoma; EBMD epithelial/anterior basement membrane dystrophy; ACIOL anterior chamber intraocular lens; IOL intraocular lens; PCIOL posterior chamber intraocular lens; Phaco/IOL phacoemulsification with intraocular lens placement; PRK photorefractive keratectomy; LASIK laser assisted in situ keratomileusis; HTN hypertension; DM diabetes mellitus; COPD chronic obstructive pulmonary disease

## 2018-06-14 ENCOUNTER — Other Ambulatory Visit: Payer: Self-pay

## 2018-06-14 ENCOUNTER — Encounter (INDEPENDENT_AMBULATORY_CARE_PROVIDER_SITE_OTHER): Payer: Self-pay | Admitting: Ophthalmology

## 2018-06-14 ENCOUNTER — Ambulatory Visit (INDEPENDENT_AMBULATORY_CARE_PROVIDER_SITE_OTHER): Payer: Worker's Compensation | Admitting: Ophthalmology

## 2018-06-14 DIAGNOSIS — H209 Unspecified iridocyclitis: Secondary | ICD-10-CM | POA: Diagnosis not present

## 2018-06-14 DIAGNOSIS — Q141 Congenital malformation of retina: Secondary | ICD-10-CM

## 2018-06-14 DIAGNOSIS — H3581 Retinal edema: Secondary | ICD-10-CM | POA: Diagnosis not present

## 2018-06-14 DIAGNOSIS — H2513 Age-related nuclear cataract, bilateral: Secondary | ICD-10-CM

## 2018-06-18 ENCOUNTER — Encounter (INDEPENDENT_AMBULATORY_CARE_PROVIDER_SITE_OTHER): Payer: Self-pay | Admitting: Ophthalmology

## 2018-06-22 NOTE — Progress Notes (Signed)
[facility] - Clinic Note  06/24/2018     CHIEF COMPLAINT Patient presents for Retina Evaluation   HISTORY OF PRESENT ILLNESS: Tanner Galvan is a 43 y.o. male who presents to the clinic today for:   HPI    Retina Evaluation    In left eye.  This started weeks ago.  Duration of weeks.  Response to treatment was significant improvement.  I, the attending physician,  performed the HPI with the patient and updated documentation appropriately.          Comments    Patient states vision is stable.  He denies eye pain or discomfort.  Patient denies any new or worsening floaters or fol OU.       Last edited by Rennis ChrisZamora, Joy Haegele, MD on 06/24/2018 11:19 AM. (History)    pt states he feels like his vision is doing better, he denies pain today, he states he has been able to stay on top of his drops. Ran out of atropine on Thursday.  Referring physician: No referring provider defined for this encounter.  HISTORICAL INFORMATION:   Selected notes from the MEDICAL RECORD NUMBER [facility] OS LEE:  Ocular Hx- PMH-   CURRENT MEDICATIONS: Current Outpatient Medications (Ophthalmic Drugs)  Medication Sig  . atropine 1 % ophthalmic solution Place 1 drop into the left eye every 2 (two) hours while awake.  . prednisoLONE acetate (PRED FORTE) 1 % ophthalmic suspension Place 1 drop into the left eye every 2 (two) hours while awake.   No current facility-administered medications for this visit.  (Ophthalmic Drugs)   No current outpatient medications on file. (Other)   No current facility-administered medications for this visit.  (Other)      REVIEW OF SYSTEMS: ROS    Positive for: Eyes   Negative for: Constitutional, Gastrointestinal, Neurological, Skin, Genitourinary, Musculoskeletal, HENT, Endocrine, Cardiovascular, Respiratory, Psychiatric, Allergic/Imm, Heme/Lymph   Last edited by Corrinne EagleEnglish, [name] on 06/24/2018 10:35 AM.  (History)       ALLERGIES No Known Allergies  PAST MEDICAL HISTORY Past Medical History:  Diagnosis Date  . Cataract    OU   History reviewed. No pertinent surgical history.  FAMILY HISTORY Family History  Problem Relation Age of Onset  . Diabetes Mother     SOCIAL HISTORY Social History   Tobacco Use  . Smoking status: Current Every Day Smoker    Packs/day: 0.50    Types: Cigarettes  . Smokeless tobacco: Never Used  Substance Use Topics  . Alcohol use: Yes  . Drug use: Not on file         OPHTHALMIC EXAM:  Base Eye Exam    Visual Acuity (Snellen - Linear)      Right Left   Dist cc 20/25 20/25   Dist ph cc NI 20/20 -1       Tonometry (Tonopen, 10:37 AM)      Right Left   Pressure 15 15       Pupils      Dark Light Shape React APD   Right 3 2 Round Brisk 0   Left Dilated           Extraocular Movement      Right Left    Full Full       Neuro/Psych    Oriented x3: Yes   Mood/Affect: Normal       Dilation    Left eye: 1.0% Mydriacyl, 2.5% Phenylephrine @  10:37 AM        Slit Lamp and Fundus Exam    Slit Lamp Exam      Right Left   Lids/Lashes mild Meibomian gland dysfunction mild lid edema   Conjunctiva/Sclera mild Melanosis trace Injection, mild Melanosis   Cornea mild Arcus mild Arcus, no epi defect   Anterior Chamber Deep and quiet deep, 1+cell/pigment - mostly pigment   Iris Round and dilated Round and moderately dilated   Lens 1+ Cortical cataract 1+ Nuclear sclerosis, 1+ Cortical cataract   Vitreous Vitreous syneresis Vitreous syneresis, trace pigment in anterior vitreous       Fundus Exam      Right Left   Disc Pink and Sharp Pink and Sharp   C/D Ratio 0.4 0.55   Macula Flat, Good foveal reflex, No heme or edema Flat, Good foveal reflex, No heme or edema   Vessels mild Vascular attenuation, mild Copper wiring mild Vascular attenuation   Periphery Attached, pigmented CHRPE with +lacunae at 0900 Attached, No RT/RD         Refraction    Wearing Rx      Sphere Cylinder Axis   Right -2.25 +1.50 165   Left -2.25 +1.25 020          IMAGING AND PROCEDURES  Imaging and Procedures for @TODAY @  OCT, Retina - OU - Both Eyes       Right Eye Quality was good. Central Foveal Thickness: 266. Progression has been stable. Findings include normal foveal contour, no SRF, no IRF (Trace ERM).   Left Eye Quality was good. Central Foveal Thickness: 272. Progression has been stable. Findings include normal foveal contour, no SRF, no IRF.   Notes *Images captured and stored on drive  Diagnosis / Impression:  NFP, no IRF/SRF OU Trace ERM OD   Clinical management:  See below  Abbreviations: NFP - Normal foveal profile. CME - cystoid macular edema. PED - pigment epithelial detachment. IRF - intraretinal fluid. SRF - subretinal fluid. EZ - ellipsoid zone. ERM - epiretinal membrane. ORA - outer retinal atrophy. ORT - outer retinal tubulation. SRHM - subretinal hyper-reflective material                 ASSESSMENT/PLAN:    ICD-10-CM   1. 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 Retinal edema  H35.81 OCT, Retina - OU - Both Eyes  3. 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 No retinal edema on exam  or OCT  3. Nuclear sclerosis OU  - The symptoms of cataract, surgical options, and treatments and risks were discussed with patient.  - discussed diagnosis and progression  -  not yet visually significant  - monitor for now  4. CHRPE OD  - discrete pigmented lesion with lacuane located at 0900 -- stable  - Optos images obtained at initial visit  - monitor   Ophthalmic Meds Ordered this visit:  No orders of the defined types were placed in this encounter.      Return for f/u week of June 29, traumatic iritis OS.  There are no Patient Instructions on file for this visit.   Explained the diagnoses, plan, and follow up with the patient and they expressed understanding.  Patient expressed understanding of the importance of proper follow up care.   This document serves as a record of services personally performed by Karie ChimeraBrian G. Aliza Moret, MD, PhD. It was created on their behalf by Laurian BrimAmanda Brown, OA, an ophthalmic assistant. The creation of this record is the provider's dictation and/or activities during the visit.    Electronically signed by: Laurian BrimAmanda Brown, OA 06.20.2020 12:50 PM    Karie ChimeraBrian G. Soniyah Mcglory, M.D., Ph.D. Diseases & Surgery of the Retina and Vitreous [facility]  I have reviewed the above documentation for accuracy and completeness, and I agree with the above. Karie ChimeraBrian G. Keeon Zurn, M.D., Ph.D. 06/25/18 12:52 PM    Abbreviations: M myopia (nearsighted); A astigmatism; H hyperopia (farsighted); P presbyopia; Mrx spectacle prescription;  CTL contact lenses; OD right eye; OS left eye; OU both eyes  XT exotropia; ET esotropia; PEK punctate epithelial keratitis; PEE punctate epithelial erosions; DES dry eye syndrome; MGD meibomian gland dysfunction; ATs artificial tears; PFAT's preservative free artificial tears; NSC nuclear sclerotic cataract; PSC posterior subcapsular cataract; ERM epi-retinal membrane; PVD posterior vitreous detachment; RD retinal detachment; DM  diabetes mellitus; DR diabetic retinopathy; NPDR non-proliferative diabetic retinopathy; PDR proliferative diabetic retinopathy; CSME clinically significant macular edema; DME diabetic macular edema; dbh dot blot hemorrhages; CWS cotton wool spot; POAG primary open angle glaucoma; C/D cup-to-disc ratio; HVF humphrey visual field; GVF goldmann visual field; OCT optical coherence tomography; IOP intraocular pressure; BRVO Branch retinal vein occlusion; CRVO central retinal vein occlusion; CRAO central retinal artery occlusion; BRAO branch retinal artery occlusion; RT retinal tear; SB scleral buckle; PPV pars plana vitrectomy; VH Vitreous hemorrhage; PRP panretinal laser photocoagulation; IVK intravitreal kenalog; VMT vitreomacular traction; MH Macular hole;  NVD neovascularization of the disc; NVE neovascularization elsewhere; AREDS age related eye disease study; ARMD age related macular degeneration; POAG primary open angle glaucoma; EBMD epithelial/anterior basement membrane dystrophy; ACIOL anterior chamber intraocular lens; IOL intraocular lens; PCIOL posterior chamber intraocular lens; Phaco/IOL phacoemulsification with intraocular lens placement; PRK photorefractive keratectomy; LASIK laser assisted in situ keratomileusis; HTN hypertension; DM diabetes mellitus; COPD chronic obstructive pulmonary disease

## 2018-06-24 ENCOUNTER — Other Ambulatory Visit: Payer: Self-pay

## 2018-06-24 ENCOUNTER — Ambulatory Visit (INDEPENDENT_AMBULATORY_CARE_PROVIDER_SITE_OTHER): Payer: Worker's Compensation | Admitting: Ophthalmology

## 2018-06-24 DIAGNOSIS — H3581 Retinal edema: Secondary | ICD-10-CM

## 2018-06-24 DIAGNOSIS — Q141 Congenital malformation of retina: Secondary | ICD-10-CM

## 2018-06-24 DIAGNOSIS — H209 Unspecified iridocyclitis: Secondary | ICD-10-CM

## 2018-06-24 DIAGNOSIS — H2513 Age-related nuclear cataract, bilateral: Secondary | ICD-10-CM | POA: Diagnosis not present

## 2018-06-25 ENCOUNTER — Encounter (INDEPENDENT_AMBULATORY_CARE_PROVIDER_SITE_OTHER): Payer: Self-pay | Admitting: Ophthalmology

## 2018-07-01 NOTE — Progress Notes (Signed)
[facility] Note  07/02/2018     CHIEF COMPLAINT Patient presents for Retina Follow Up   HISTORY OF PRESENT ILLNESS: Tanner Galvan is a 43 y.o. male who presents to the clinic today for:   HPI    Retina Follow Up    Patient presents with  Other.  In left eye.  This started 8 days ago.  Severity is moderate.  I, the attending physician,  performed the HPI with the patient and updated documentation appropriately.          Comments    Patient here for 8 days retina follow up for traumatic iritis/AC check OS. Patient states vision is a lot better. No eye pain. Has some slight blurriness OS.       Last edited by Bernarda Caffey, MD on 07/02/2018  3:32 PM. (History)    pt states he is still using PF 6 times a day, he states his vision continues to improve, he denies any sensitivity to sunlight  Referring physician: No referring provider defined for this encounter.  HISTORICAL INFORMATION:   Selected notes from the MEDICAL RECORD NUMBER Referred by [facility] for traumatic injury OS LEE:  Ocular Hx- PMH-   CURRENT MEDICATIONS: Current Outpatient Medications (Ophthalmic Drugs)  Medication Sig  . atropine 1 % ophthalmic solution Place 1 drop into the left eye every 2 (two) hours while awake.  . prednisoLONE acetate (PRED FORTE) 1 % ophthalmic suspension Place 1 drop into the left eye every 2 (two) hours while awake.   No current facility-administered medications for this visit.  (Ophthalmic Drugs)   No current outpatient medications on file. (Other)   No current facility-administered medications for this visit.  (Other)      REVIEW OF SYSTEMS: ROS    Positive for: Eyes   Negative for: Constitutional, Gastrointestinal, Neurological, Skin, Genitourinary, Musculoskeletal, HENT, Endocrine, Cardiovascular, Respiratory, Psychiatric, Allergic/Imm, Heme/Lymph   Last edited by Theodore Demark on 07/02/2018  2:09 PM. (History)        ALLERGIES No Known Allergies  PAST MEDICAL HISTORY Past Medical History:  Diagnosis Date  . Cataract    OU   History reviewed. No pertinent surgical history.  FAMILY HISTORY Family History  Problem Relation Age of Onset  . Diabetes Mother     SOCIAL HISTORY Social History   Tobacco Use  . Smoking status: Current Every Day Smoker    Packs/day: 0.50    Types: Cigarettes  . Smokeless tobacco: Never Used  Substance Use Topics  . Alcohol use: Yes  . Drug use: Not on file         OPHTHALMIC EXAM:  Base Eye Exam    Visual Acuity (Snellen - Linear)      Right Left   Dist cc 20/20 -1 20/20 -1   Correction: Glasses       Tonometry (Tonopen, 2:06 PM)      Right Left   Pressure 14 12       Pupils      Dark Light Shape React APD   Right 3 2 Round Brisk None   Left 5 5 Round 0 None       Visual Fields (Counting fingers)      Left Right    Full Full       Extraocular Movement      Right Left    Full, Ortho Full, Ortho       Neuro/Psych    Oriented x3:  Yes   Mood/Affect: Normal       Dilation    Both eyes: 1.0% Mydriacyl, 2.5% Phenylephrine @ 2:06 PM        Slit Lamp and Fundus Exam    Slit Lamp Exam      Right Left   Lids/Lashes mild Meibomian gland dysfunction mild lid edema   Conjunctiva/Sclera mild Melanosis trace Injection, mild Melanosis   Cornea mild Arcus mild Arcus, no epi defect   Anterior Chamber Deep and quiet deep, 0.5+pigment, no cell   Iris Round and dilated Round and moderately dilated   Lens 1+ Cortical cataract 1+ Nuclear sclerosis, 1+ Cortical cataract   Vitreous Vitreous syneresis Vitreous syneresis, trace pigment in anterior vitreous       Fundus Exam      Right Left   Disc Pink and Sharp Pink and Sharp   C/D Ratio 0.4 0.55   Macula Flat, Good foveal reflex, No heme or edema Flat, Good foveal reflex, No heme or edema   Vessels mild Vascular attenuation, mild Copper wiring mild Vascular attenuation   Periphery  Attached, pigmented CHRPE with +lacunae at 0900 Attached, No RT/RD        Refraction    Wearing Rx      Sphere Cylinder Axis   Right -2.25 +1.50 165   Left -2.25 +1.25 020          IMAGING AND PROCEDURES  Imaging and Procedures for @TODAY @  OCT, Retina - OU - Both Eyes       Right Eye Quality was good. Central Foveal Thickness: 270. Progression has been stable. Findings include normal foveal contour, no SRF, no IRF (Trace ERM).   Left Eye Quality was good. Central Foveal Thickness: 275. Progression has been stable. Findings include normal foveal contour, no SRF, no IRF.   Notes *Images captured and stored on drive  Diagnosis / Impression:  NFP, no IRF/SRF OU Trace ERM OD   Clinical management:  See below  Abbreviations: NFP - Normal foveal profile. CME - cystoid macular edema. PED - pigment epithelial detachment. IRF - intraretinal fluid. SRF - subretinal fluid. EZ - ellipsoid zone. ERM - epiretinal membrane. ORA - outer retinal atrophy. ORT - outer retinal tubulation. SRHM - subretinal hyper-reflective material                 ASSESSMENT/PLAN:    ICD-10-CM   1. 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 Retinal edema  H35.81 OCT, Retina - OU - Both Eyes  3. 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 No retinal edema on exam or OCT  3. Nuclear sclerosis OU  - The symptoms of cataract, surgical options, and treatments and risks were discussed with  patient.  - discussed diagnosis and progression  - not yet visually significant  - monitor for now  4. CHRPE OD  - discrete pigmented lesion with lacuane located at 0900 --  stable  - Optos images obtained at initial visit  - monitor   Ophthalmic Meds Ordered this visit:  No orders of the defined types were placed in this encounter.      Return in about 6 weeks (around 08/13/2018) for f/u traumatic iritis OS.  There are no Patient Instructions on file for this visit.   Explained the diagnoses, plan, and follow up with the patient and they expressed understanding.  Patient expressed understanding of the importance of proper follow up care.   This document serves as a record of services personally performed by Karie ChimeraBrian G. Simara Rhyner, MD, PhD. It was created on their behalf by Laurian BrimAmanda Brown, OA, an ophthalmic assistant. The creation of this record is the provider's dictation and/or activities during the visit.    Electronically signed by: Laurian BrimAmanda Brown, OA  06.29.2020 5:59 PM    Karie ChimeraBrian G. Pearly Apachito, M.D., Ph.D. Diseases & Surgery of the Retina and Vitreous [facility]  I have reviewed the above documentation for accuracy and completeness, and I agree with the above. Karie ChimeraBrian G. Kalena Mander, M.D., Ph.D. 07/02/18 5:59 PM    Abbreviations: M myopia (nearsighted); A astigmatism; H hyperopia (farsighted); P presbyopia; Mrx spectacle prescription;  CTL contact lenses; OD right eye; OS left eye; OU both eyes  XT exotropia; ET esotropia; PEK punctate epithelial keratitis; PEE punctate epithelial erosions; DES dry eye syndrome; MGD meibomian gland dysfunction; ATs artificial tears; PFAT's preservative free artificial tears; NSC nuclear sclerotic cataract; PSC posterior subcapsular cataract; ERM epi-retinal membrane; PVD posterior vitreous detachment; RD retinal detachment; DM diabetes mellitus; DR diabetic retinopathy; NPDR non-proliferative diabetic retinopathy; PDR proliferative diabetic  retinopathy; CSME clinically significant macular edema; DME diabetic macular edema; dbh dot blot hemorrhages; CWS cotton wool spot; POAG primary open angle glaucoma; C/D cup-to-disc ratio; HVF humphrey visual field; GVF goldmann visual field; OCT optical coherence tomography; IOP intraocular pressure; BRVO Branch retinal vein occlusion; CRVO central retinal vein occlusion; CRAO central retinal artery occlusion; BRAO branch retinal artery occlusion; RT retinal tear; SB scleral buckle; PPV pars plana vitrectomy; VH Vitreous hemorrhage; PRP panretinal laser photocoagulation; IVK intravitreal kenalog; VMT vitreomacular traction; MH Macular hole;  NVD neovascularization of the disc; NVE neovascularization elsewhere; AREDS age related eye disease study; ARMD age related macular degeneration; POAG primary open angle glaucoma; EBMD epithelial/anterior basement membrane dystrophy; ACIOL anterior chamber intraocular lens; IOL intraocular lens; PCIOL posterior chamber intraocular lens; Phaco/IOL phacoemulsification with intraocular lens placement; PRK photorefractive keratectomy; LASIK laser assisted in situ keratomileusis; HTN hypertension; DM diabetes mellitus; COPD chronic obstructive pulmonary disease

## 2018-07-02 ENCOUNTER — Ambulatory Visit (INDEPENDENT_AMBULATORY_CARE_PROVIDER_SITE_OTHER): Payer: Worker's Compensation | Admitting: Ophthalmology

## 2018-07-02 ENCOUNTER — Other Ambulatory Visit: Payer: Self-pay

## 2018-07-02 ENCOUNTER — Encounter (INDEPENDENT_AMBULATORY_CARE_PROVIDER_SITE_OTHER): Payer: Self-pay | Admitting: Ophthalmology

## 2018-07-02 DIAGNOSIS — Q141 Congenital malformation of retina: Secondary | ICD-10-CM

## 2018-07-02 DIAGNOSIS — H2513 Age-related nuclear cataract, bilateral: Secondary | ICD-10-CM

## 2018-07-02 DIAGNOSIS — H3581 Retinal edema: Secondary | ICD-10-CM | POA: Diagnosis not present

## 2018-07-02 DIAGNOSIS — H209 Unspecified iridocyclitis: Secondary | ICD-10-CM | POA: Diagnosis not present

## 2018-08-12 NOTE — Progress Notes (Signed)
[facility] Note  08/13/2018     CHIEF COMPLAINT Patient presents for Retina Follow Up   HISTORY OF PRESENT ILLNESS: Tanner Galvan is a 43 y.o. male who presents to the clinic today for:   HPI    Retina Follow Up    Patient presents with  Other.  In right eye.  This started weeks ago.  Severity is mild.  Duration of 2 months.  Since onset it is stable.  I, the attending physician,  performed the HPI with the patient and updated documentation appropriately.          Comments    43 y/o male pt here for 6 wk f/u for traumatic iritis OD.  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  No change in [place] OU.  Denies pain, flashes, floaters, photophobia, fbs.  No gtts.       Last edited by Bernarda Caffey, MD on 08/13/2018 10:07 AM. (History)    pt states he completed the PF taper as directed, he states he has not noticed any change in his vision or eye appearance since he was here last   Referring physician: No referring provider defined for this encounter.  HISTORICAL INFORMATION:   Selected notes from the MEDICAL RECORD NUMBER [facility] for traumatic injury OS LEE:  Ocular Hx- PMH-   CURRENT MEDICATIONS: Current Outpatient Medications (Ophthalmic Drugs)  Medication Sig  . atropine 1 % ophthalmic solution Place 1 drop into the left eye every 2 (two) hours while awake.  . prednisoLONE acetate (PRED FORTE) 1 % ophthalmic suspension Place 1 drop into the left eye every 2 (two) hours while awake. (Patient not taking: Reported on 08/13/2018)   No current facility-administered medications for this visit.  (Ophthalmic Drugs)   No current outpatient medications on file. (Other)   No current facility-administered medications for this visit.  (Other)      REVIEW OF SYSTEMS: ROS    Positive for: Eyes   Negative for: Constitutional, Gastrointestinal, Neurological, Skin,  Genitourinary, Musculoskeletal, HENT, Endocrine, Cardiovascular, Respiratory, Psychiatric, Allergic/Imm, Heme/Lymph   Last edited by Matthew Folks, COA on 08/13/2018  9:41 AM. (History)       ALLERGIES No Known Allergies  PAST MEDICAL HISTORY Past Medical History:  Diagnosis Date  . Cataract    OU   History reviewed. No pertinent surgical history.  FAMILY HISTORY Family History  Problem Relation Age of Onset  . Diabetes Mother     SOCIAL HISTORY Social History   Tobacco Use  . Smoking status: Current Every Day Smoker    Packs/day: 0.50    Types: Cigarettes  . Smokeless tobacco: Never Used  Substance Use Topics  . Alcohol use: Yes  . Drug use: Not on file         OPHTHALMIC EXAM:  Base Eye Exam    Visual Acuity (Snellen - Linear)      Right Left   Dist cc 20/20 20/20   Correction: Glasses       Tonometry (Tonopen, 9:44 AM)      Right Left   Pressure 14 13       Pupils      Dark Light Shape React APD   Right 3 2 Round Brisk None   Left 3 2 Round Brisk None       Visual Fields (Counting fingers)      Left Right  Full Full       Extraocular Movement      Right Left    Full, Ortho Full, Ortho       Neuro/Psych    Oriented x3: Yes   Mood/Affect: Normal       Dilation    Both eyes: 1.0% Mydriacyl, 2.5% Phenylephrine @ 9:44 AM        Slit Lamp and Fundus Exam    Slit Lamp Exam      Right Left   Lids/Lashes mild Meibomian gland dysfunction mild lid edema   Conjunctiva/Sclera mild Melanosis mild Melanosis   Cornea mild Arcus mild Arcus   Anterior Chamber Deep and quiet Deep and quiet   Iris Round and dilated Round and moderately dilated   Lens 1+ Cortical cataract 1+ Nuclear sclerosis, 1+ Cortical cataract   Vitreous Vitreous syneresis Vitreous syneresis, trace pigment in anterior vitreous       Fundus Exam      Right Left   Disc Pink and Sharp Pink and Sharp   C/D Ratio 0.4 0.6   Macula Flat, Good foveal reflex, No heme or  edema Flat, Good foveal reflex, No heme or edema   Vessels mild Vascular attenuation, mild Copper wiring, mild Tortuousity mild Vascular attenuation, mild Copper wiring, mild Tortuousity   Periphery Attached, pigmented CHRPE with +lacunae at 0900 -- stable Attached, No RT/RD          IMAGING AND PROCEDURES  Imaging and Procedures for @TODAY @  OCT, Retina - OU - Both Eyes       Right Eye Quality was good. Central Foveal Thickness: 271. Progression has been stable. Findings include normal foveal contour, no SRF, no IRF.   Left Eye Quality was good. Central Foveal Thickness: 271. Progression has been stable. Findings include normal foveal contour, no SRF, no IRF.   Notes *Images captured and stored on drive  Diagnosis / Impression:  NFP, no IRF/SRF OU   Clinical management:  See below  Abbreviations: NFP - Normal foveal profile. CME - cystoid macular edema. PED - pigment epithelial detachment. IRF - intraretinal fluid. SRF - subretinal fluid. EZ - ellipsoid zone. ERM - epiretinal membrane. ORA - outer retinal atrophy. ORT - outer retinal tubulation. SRHM - subretinal hyper-reflective material                 ASSESSMENT/PLAN:    ICD-10-CM   1. 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 Retinal edema  H35.81 OCT, Retina - OU - Both Eyes  3. 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 No retinal edema on exam or OCT  3. Nuclear sclerosis OU  - The symptoms of cataract, surgical options, and treatments and risks were discussed with patient.  - discussed diagnosis and progression  - not yet visually significant  - monitor for now  4.  CHRPE OD  - discrete pigmented lesion with lacuane located at 0900 -- stable  - Optos images obtained at initial visit  - monitor   Ophthalmic Meds Ordered this visit:  No orders of the defined  types were placed in this encounter.      Return if symptoms worsen or fail to improve.  There are no Patient Instructions on file for this visit.   Explained the diagnoses, plan, and follow up with the patient and they expressed understanding.  Patient expressed understanding of the importance of proper follow up care.   This document serves as a record of services personally performed by Karie ChimeraBrian G. Ameris Akamine, MD, PhD. It was created on their behalf by Laurian BrimAmanda Brown, OA, an ophthalmic assistant. The creation of this record is the provider's dictation and/or activities during the visit.    Electronically signed by: Laurian BrimAmanda Brown, OA  08.10.2020 11:16 AM    Karie ChimeraBrian G. Vernisha Bacote, M.D., Ph.D. Diseases & Surgery of the Retina and Vitreous [facility]  I have reviewed the above documentation for accuracy and completeness, and I agree with the above. Karie ChimeraBrian G. Chesky Heyer, M.D., Ph.D. 08/13/18 11:17 AM     Abbreviations: M myopia (nearsighted); A astigmatism; H hyperopia (farsighted); P presbyopia; Mrx spectacle prescription;  CTL contact lenses; OD right eye; OS left eye; OU both eyes  XT exotropia; ET esotropia; PEK punctate epithelial keratitis; PEE punctate epithelial erosions; DES dry eye syndrome; MGD meibomian gland dysfunction; ATs artificial tears; PFAT's preservative free artificial tears; NSC nuclear sclerotic cataract; PSC posterior subcapsular cataract; ERM epi-retinal membrane; PVD posterior vitreous detachment; RD retinal detachment; DM diabetes mellitus; DR diabetic retinopathy; NPDR non-proliferative diabetic retinopathy; PDR proliferative diabetic retinopathy; CSME clinically significant macular edema; DME diabetic macular edema; dbh dot blot hemorrhages; CWS cotton wool  spot; POAG primary open angle glaucoma; C/D cup-to-disc ratio; HVF humphrey visual field; GVF goldmann visual field; OCT optical coherence tomography; IOP intraocular pressure; BRVO Branch retinal vein occlusion; CRVO central retinal vein occlusion; CRAO central retinal artery occlusion; BRAO branch retinal artery occlusion; RT retinal tear; SB scleral buckle; PPV pars plana vitrectomy; VH Vitreous hemorrhage; PRP panretinal laser photocoagulation; IVK intravitreal kenalog; VMT vitreomacular traction; MH Macular hole;  NVD neovascularization of the disc; NVE neovascularization elsewhere; AREDS age related eye disease study; ARMD age related macular degeneration; POAG primary open angle glaucoma; EBMD epithelial/anterior basement membrane dystrophy; ACIOL anterior chamber intraocular lens; IOL intraocular lens; PCIOL posterior chamber intraocular lens; Phaco/IOL phacoemulsification with intraocular lens placement; PRK photorefractive keratectomy; LASIK laser assisted in situ keratomileusis; HTN hypertension; DM diabetes mellitus; COPD chronic obstructive pulmonary disease

## 2018-08-13 ENCOUNTER — Encounter (INDEPENDENT_AMBULATORY_CARE_PROVIDER_SITE_OTHER): Payer: Self-pay | Admitting: Ophthalmology

## 2018-08-13 ENCOUNTER — Other Ambulatory Visit: Payer: Self-pay

## 2018-08-13 ENCOUNTER — Ambulatory Visit (INDEPENDENT_AMBULATORY_CARE_PROVIDER_SITE_OTHER): Payer: Worker's Compensation | Admitting: Ophthalmology

## 2018-08-13 DIAGNOSIS — H2513 Age-related nuclear cataract, bilateral: Secondary | ICD-10-CM

## 2018-08-13 DIAGNOSIS — H3581 Retinal edema: Secondary | ICD-10-CM

## 2018-08-13 DIAGNOSIS — H209 Unspecified iridocyclitis: Secondary | ICD-10-CM

## 2018-08-13 DIAGNOSIS — Q141 Congenital malformation of retina: Secondary | ICD-10-CM
